# Patient Record
Sex: Female | Born: 2000 | Race: Black or African American | Hispanic: No | Marital: Single | Smoking: Never smoker
Health system: Southern US, Community
[De-identification: ages and names within clinical notes are randomized; demographics above are authoritative.]

## PROBLEM LIST (undated history)

## (undated) DIAGNOSIS — I471 Supraventricular tachycardia: Secondary | ICD-10-CM

---
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
## 2019-01-03 ENCOUNTER — Ambulatory Visit
Admission: RE | Admit: 2019-01-03 | Discharge: 2019-01-03 | Disposition: A | Discharge status: Home / Self Care | Payer: No Typology Code available for payment source | Source: Ambulatory Visit | Attending: Pediatrics | Admitting: Pediatrics

## 2019-01-03 ENCOUNTER — Other Ambulatory Visit: Payer: Self-pay | Admitting: Pediatrics

## 2019-01-03 DIAGNOSIS — N6320 Unspecified lump in the left breast, unspecified quadrant: Secondary | ICD-10-CM

## 2019-07-09 ENCOUNTER — Inpatient Hospital Stay: Admission: RE | Admit: 2019-07-09 | Payer: No Typology Code available for payment source | Source: Ambulatory Visit

## 2019-09-16 ENCOUNTER — Other Ambulatory Visit: Payer: Self-pay

## 2019-09-16 ENCOUNTER — Emergency Department (HOSPITAL_COMMUNITY)
Admission: EM | Admit: 2019-09-16 | Discharge: 2019-09-16 | Disposition: A | Discharge status: Home / Self Care | Payer: BC Managed Care – PPO | Attending: Emergency Medicine | Admitting: Emergency Medicine

## 2019-09-16 ENCOUNTER — Encounter (HOSPITAL_COMMUNITY): Payer: Self-pay

## 2019-09-16 DIAGNOSIS — R531 Weakness: Secondary | ICD-10-CM | POA: Insufficient documentation

## 2019-09-16 DIAGNOSIS — Z711 Person with feared health complaint in whom no diagnosis is made: Secondary | ICD-10-CM | POA: Diagnosis not present

## 2019-09-16 DIAGNOSIS — U071 COVID-19: Secondary | ICD-10-CM | POA: Diagnosis present

## 2019-09-16 NOTE — Discharge Instructions (Signed)
Your vital signs look good today and your oxygen levels are normal.  Continue to stay well-hydrated and monitor your symptoms at home.  If you start feeling worse having any difficulty breathing or other concerning symptoms occur return to the emergency department.

## 2019-09-16 NOTE — ED Triage Notes (Signed)
Pt reports RN advised her to come to ED for BP 145/105. Pt reports she was recently dx w/covid Tuesday and was concerned about her "beat per minute" and she started feeling weak a couple of minutes ago

## 2019-09-16 NOTE — ED Provider Notes (Signed)
[facility] EMERGENCY DEPARTMENT Provider Note   CSN: [id number] Arrival date & time: 09/16/19  1816     History Chief Complaint  Patient presents with  . Weakness    Haley Marks is a 19 y.o. female.  Haley Marks is a 19 y.o. female with a history of SVT, who presents to the emergency department for concerns regarding her heart rate and blood pressure.  Patient states that on Tuesday she was diagnosed with Covid, and aside from some fatigue and decreased appetite she has been fairly asymptomatic.  She has not had fevers, cough, congestion, nausea, vomiting or diarrhea.  She is just felt more tired than usual.  She has been taking it easy over the past few days and was laying on the couch today and was feeling weak.  She checked her heart rate and it was between 60-70.  She did not know if this was normal, she has a history of SVT and was worried that maybe this was too slow.  Her mom checked her blood pressure and it was 145/105, patient does report she was feeling very anxious when her blood pressure was taken, she does not have any history of blood pressure issues.  Her mom called a nurse who recommended that she come in to get checked out.  Patient denies any shortness of breath or chest pain.  She denies any palpitations or sensation of heart racing.  No other aggravating or alleviating factors.        History reviewed. No pertinent past medical history.  There are no problems to display for this patient.   No past surgical history on file.   OB History   No obstetric history on file.     No family history on file.  Social History   Tobacco Use  . Smoking status: Never Smoker  . Smokeless tobacco: Never Used  Substance Use Topics  . Alcohol use: No  . Drug use: Never    Home Medications Prior to Admission medications   Not on File    Allergies    Amoxicillin and Penicillins  Review of Systems   Review of Systems  Constitutional:  Positive for fatigue. Negative for chills and fever.  HENT: Negative.   Respiratory: Negative for cough and shortness of breath.   Cardiovascular: Negative for chest pain, palpitations and leg swelling.  Gastrointestinal: Negative for abdominal pain, diarrhea, nausea and vomiting.  Musculoskeletal: Negative for arthralgias and myalgias.  Skin: Negative for color change and rash.  Neurological: Negative for dizziness, syncope, weakness, light-headedness and numbness.    Physical Exam Updated Vital Signs BP 124/85 (BP Location: Right Arm)   Pulse 79   Temp 99.5 F (37.5 C) (Oral)   Resp 17   Ht 5' (1.524 m)   Wt 59 kg   SpO2 100%   BMI 25.39 kg/m   Physical Exam Vitals and nursing note reviewed.  Constitutional:      General: She is not in acute distress.    Appearance: Normal appearance. She is well-developed and normal weight. She is not ill-appearing or diaphoretic.  HENT:     Head: Normocephalic and atraumatic.  Eyes:     General:        Right eye: No discharge.        Left eye: No discharge.  Neck:     Comments: No rigidity Cardiovascular:     Rate and Rhythm: Normal rate and regular rhythm.     Pulses: Normal pulses.  Heart sounds: Normal heart sounds. No murmur. No friction rub. No gallop.   Pulmonary:     Effort: Pulmonary effort is normal. No respiratory distress.     Breath sounds: Normal breath sounds.     Comments: Respirations equal and unlabored, patient able to speak in full sentences, lungs clear to auscultation bilaterally Abdominal:     General: Bowel sounds are normal. There is no distension.     Palpations: Abdomen is soft. There is no mass.     Tenderness: There is no abdominal tenderness. There is no guarding.     Comments: Abdomen soft, nondistended, nontender to palpation in all quadrants without guarding or peritoneal signs  Musculoskeletal:        General: No deformity.     Cervical back: Neck supple.  Lymphadenopathy:     Cervical: No  cervical adenopathy.  Skin:    General: Skin is warm and dry.     Capillary Refill: Capillary refill takes less than 2 seconds.  Neurological:     Mental Status: She is alert and oriented to person, place, and time.  Psychiatric:        Mood and Affect: Mood normal.        Behavior: Behavior normal.     ED Results / Procedures / Treatments   Labs (all labs ordered are listed, but only abnormal results are displayed) Labs Reviewed - No data to display  EKG None  Radiology No results found.  Procedures Procedures (including critical care time)  Medications Ordered in ED Medications - No data to display  ED Course  I have reviewed the triage vital signs and the nursing notes.  Pertinent labs & imaging results that were available during my care of the patient were reviewed by me and considered in my medical decision making (see chart for details).    MDM Rules/Calculators/A&P                      19 year old female who was recently diagnosed with Covid has been largely asymptomatic aside from fatigue and decreased appetite.  Comes in today worried about her heart rate and blood pressure.  History of SVT but no palpitations or heart racing sensation.  States that she checked her heart rate today when she was feeling fatigued and tired, and it was between 60-70, she states that she does not know what is normal and was worried that this was too low.  Her mom checked her blood pressure and it was a little elevated at 145/105.  Patient was very anxious when blood pressure was taken and she appears anxious here today.  On arrival mildly tachycardic at 101 but all other vitals normal patient is well-appearing.  She has no shortness of breath and is ambulatory in the room maintaining oxygen saturation greater than 99%.  Had discussion with patient about what heart rates are normal and that 60-70 is very normal and appropriate when she is at rest.  I suspect that her blood pressure was mildly  elevating in the setting of anxiety as it is normal here.  Provided patient with reassurance regarding Covid symptoms and discussed return precautions.   At this time there does not appear to be any evidence of an acute emergency medical condition and the patient appears stable for discharge with appropriate outpatient follow up.  Xiana Carns was evaluated in Emergency Department on 09/18/2019 for the symptoms described in the history of present illness. She was evaluated in the context  of the global COVID-19 pandemic, which necessitated consideration that the patient might be at risk for infection with the SARS-CoV-2 virus that causes COVID-19. Institutional protocols and algorithms that pertain to the evaluation of patients at risk for COVID-19 are in a state of rapid change based on information released by regulatory bodies including the CDC and federal and state organizations. These policies and algorithms were followed during the patient's care in the ED.  Final Clinical Impression(s) / ED Diagnoses Final diagnoses:  COVID-19 virus infection  Physically well but worried    Rx / DC Orders ED Discharge Orders    None       Dartha Lodge, [place] 09/18/19 1418    Blane Ohara, MD 09/19/19 1544

## 2019-11-15 ENCOUNTER — Other Ambulatory Visit: Payer: BC Managed Care – PPO

## 2019-11-22 ENCOUNTER — Ambulatory Visit
Admission: RE | Admit: 2019-11-22 | Discharge: 2019-11-22 | Disposition: A | Discharge status: Home / Self Care | Payer: BC Managed Care – PPO | Source: Ambulatory Visit | Attending: Pediatrics | Admitting: Pediatrics

## 2019-11-22 ENCOUNTER — Other Ambulatory Visit: Payer: Self-pay | Admitting: Pediatrics

## 2019-11-22 ENCOUNTER — Other Ambulatory Visit: Payer: Self-pay

## 2019-11-22 DIAGNOSIS — N6320 Unspecified lump in the left breast, unspecified quadrant: Secondary | ICD-10-CM

## 2019-12-04 ENCOUNTER — Other Ambulatory Visit: Payer: BC Managed Care – PPO

## 2020-10-15 DIAGNOSIS — Z03818 Encounter for observation for suspected exposure to other biological agents ruled out: Secondary | ICD-10-CM | POA: Diagnosis not present

## 2020-10-15 DIAGNOSIS — J029 Acute pharyngitis, unspecified: Secondary | ICD-10-CM | POA: Diagnosis not present

## 2020-10-15 DIAGNOSIS — Z20822 Contact with and (suspected) exposure to covid-19: Secondary | ICD-10-CM | POA: Diagnosis not present

## 2020-11-03 DIAGNOSIS — Z20822 Contact with and (suspected) exposure to covid-19: Secondary | ICD-10-CM | POA: Diagnosis not present

## 2020-11-03 DIAGNOSIS — Z03818 Encounter for observation for suspected exposure to other biological agents ruled out: Secondary | ICD-10-CM | POA: Diagnosis not present

## 2020-11-03 DIAGNOSIS — R059 Cough, unspecified: Secondary | ICD-10-CM | POA: Diagnosis not present

## 2020-11-18 ENCOUNTER — Encounter (HOSPITAL_COMMUNITY): Payer: Self-pay | Admitting: Emergency Medicine

## 2020-11-18 ENCOUNTER — Ambulatory Visit (HOSPITAL_COMMUNITY)
Admission: EM | Admit: 2020-11-18 | Discharge: 2020-11-18 | Disposition: A | Discharge status: Home / Self Care | Payer: BC Managed Care – PPO | Attending: Emergency Medicine | Admitting: Emergency Medicine

## 2020-11-18 ENCOUNTER — Other Ambulatory Visit: Payer: Self-pay

## 2020-11-18 DIAGNOSIS — B349 Viral infection, unspecified: Secondary | ICD-10-CM | POA: Diagnosis not present

## 2020-11-18 DIAGNOSIS — Z20822 Contact with and (suspected) exposure to covid-19: Secondary | ICD-10-CM | POA: Insufficient documentation

## 2020-11-18 DIAGNOSIS — J029 Acute pharyngitis, unspecified: Secondary | ICD-10-CM | POA: Insufficient documentation

## 2020-11-18 HISTORY — DX: Supraventricular tachycardia: I47.1

## 2020-11-18 LAB — POC INFLUENZA A AND B ANTIGEN (URGENT CARE ONLY)
INFLUENZA A ANTIGEN, POC: NEGATIVE
INFLUENZA B ANTIGEN, POC: NEGATIVE

## 2020-11-18 MED ORDER — ACETAMINOPHEN 325 MG PO TABS
650.0000 mg | ORAL_TABLET | Freq: Once | ORAL | Status: AC
  Administered 2020-11-18: 650 mg via ORAL

## 2020-11-18 MED ORDER — ACETAMINOPHEN 325 MG PO TABS
ORAL_TABLET | ORAL | Status: AC
  Filled 2020-11-18: qty 2

## 2020-11-18 NOTE — ED Provider Notes (Signed)
[facility]    CSN: [id number] Arrival date & time: 11/18/20  1617      History   Chief Complaint Chief Complaint  Patient presents with  . Weakness  . Nasal Congestion  . Sore Throat  . Cough    HPI Haley Marks is a 20 y.o. female.   Patient here for evaluation of congestion, cough, sore throat and fevers that started today.  Patient reports having a coughing fit and then vomiting while waiting in the waiting room.  Temperature was 101.9 initially in office and given Tylenol.  Denies any recent sick contacts.  Has not taken any OTC medications or treatments.  Denies any trauma, injury, or other precipitating event.  Denies any specific alleviating or aggravating factors.  Denies any fevers, chest pain, shortness of breath, N/V/D, numbness, tingling, weakness, abdominal pain, or headaches.     The history is provided by the patient.  Weakness Associated symptoms: cough and fever   Sore Throat  Cough Associated symptoms: fever     Past Medical History:  Diagnosis Date  . Supraventricular tachycardia (HCC)     There are no problems to display for this patient.   History reviewed. No pertinent surgical history.  OB History   No obstetric history on file.      Home Medications    Prior to Admission medications   Medication Sig Start Date End Date Taking? Authorizing Provider  metoprolol succinate (TOPROL-XL) 25 MG 24 hr tablet Take 1 tablet by mouth daily. 11/17/20  Yes [provider]    Family History History reviewed. No pertinent family history.  Social History Social History   Tobacco Use  . Smoking status: Never Smoker  . Smokeless tobacco: Never Used  Vaping Use  . Vaping Use: Never used  Substance Use Topics  . Alcohol use: No  . Drug use: Never     Allergies   Amoxicillin and Penicillins   Review of Systems Review of Systems  Constitutional: Positive for fever.  HENT: Positive for congestion.    Respiratory: Positive for cough.   Neurological: Positive for weakness.  All other systems reviewed and are negative.    Physical Exam Triage Vital Signs ED Triage Vitals  Enc Vitals Group     BP 11/18/20 1727 104/72     Pulse Rate 11/18/20 1727 (!) 115     Resp 11/18/20 1727 20     Temp 11/18/20 1727 (!) 101.9 F (38.8 C)     Temp Source 11/18/20 1727 Oral     SpO2 11/18/20 1727 97 %     Weight --      Height --      Head Circumference --      Peak Flow --      Pain Score 11/18/20 1724 6     Pain Loc --      Pain Edu? --      Excl. in GC? --    No data found.  Updated Vital Signs BP 104/72 (BP Location: Left Arm)   Pulse (!) 115   Temp 99.9 F (37.7 C) (Oral)   Resp 20   LMP 11/02/2020 (Approximate)   SpO2 97%   Visual Acuity Right Eye Distance:   Left Eye Distance:   Bilateral Distance:    Right Eye Near:   Left Eye Near:    Bilateral Near:     Physical Exam Vitals and nursing note reviewed.  Constitutional:      General: She is  not in acute distress.    Appearance: Normal appearance. She is not ill-appearing, toxic-appearing or diaphoretic.  HENT:     Head: Normocephalic and atraumatic.     Nose: Congestion present.     Mouth/Throat:     Pharynx: Uvula midline. No uvula swelling.     Tonsils: No tonsillar exudate or tonsillar abscesses. 0 on the right. 0 on the left.  Eyes:     Conjunctiva/sclera: Conjunctivae normal.  Cardiovascular:     Rate and Rhythm: Normal rate and regular rhythm.     Pulses: Normal pulses.     Heart sounds: Normal heart sounds.  Pulmonary:     Effort: Pulmonary effort is normal.     Breath sounds: Normal breath sounds.  Abdominal:     General: Abdomen is flat.  Musculoskeletal:        General: Normal range of motion.     Cervical back: Normal range of motion.  Skin:    General: Skin is warm and dry.  Neurological:     General: No focal deficit present.     Mental Status: She is alert and oriented to person, place,  and time.  Psychiatric:        Mood and Affect: Mood normal.      UC Treatments / Results  Labs (all labs ordered are listed, but only abnormal results are displayed) Labs Reviewed  SARS CORONAVIRUS 2 (TAT 6-24 HRS)  POC INFLUENZA A AND B ANTIGEN (URGENT CARE ONLY)    EKG   Radiology No results found.  Procedures Procedures (including critical care time)  Medications Ordered in UC Medications  acetaminophen (TYLENOL) tablet 650 mg (650 mg Oral Given 11/18/20 1734)    Initial Impression / Assessment and Plan / UC Course  I have reviewed the triage vital signs and the nursing notes.  Pertinent labs & imaging results that were available during my care of the patient were reviewed by me and considered in my medical decision making (see chart for details).     Assessment negative for red flags or concerns.  Patient reports feeling much better after Tylenol.  COVID test pending.  Flu negative.  Encourage fluids and rest.  Patient may take Tylenol and/or ibuprofen as needed for pain and fever.  Discussed using ginger and mint as needed for nausea and vomiting.  Try sticking with a BRAT or bland food diet for the next few days and then advance diet as tolerated.  Conservative symptom management discussed as described below in discharge instructions.  Follow-up with primary care as needed. Final Clinical Impressions(s) / UC Diagnoses   Final diagnoses:  Viral illness     Discharge Instructions     You most likely have viral illness.   You can take Tylenol and/or Ibuprofen as needed for fever reduction and pain relief.   It is important to stay hydrated: drink plenty of fluids (water, gatorade/powerade/pedialyte, juices, or teas) to keep your throat moisturized and help further relieve irritation/discomfort.   For vomiting: ginger and mint can help with nausea and vomiting.  Try to stick to a BRAT (bananas, rice, applesause, toast) or bland food diet and then advance as  tolerated.    For cough: honey 1/2 to 1 teaspoon (you can dilute the honey in water or another fluid).  You can use a humidifier for chest congestion and cough.  If you don't have a humidifier, you can sit in the bathroom with the hot shower running.    For sore throat:  try warm salt water gargles, cepacol lozenges, throat spray, warm tea or water with lemon/honey, popsicles or ice, or OTC cold relief medicine for throat discomfort.    For congestion: take a daily anti-histamine like Zyrtec, Claritin, and a oral decongestant to help with post nasal drip that may be irritating your throat.    Return or go to the Emergency Department if symptoms worsen or do not improve in the next few days.      ED Prescriptions    None     PDMP not reviewed this encounter.   Ivette Loyal, NP 11/18/20 1843

## 2020-11-18 NOTE — Discharge Instructions (Signed)
You most likely have viral illness.   You can take Tylenol and/or Ibuprofen as needed for fever reduction and pain relief.   It is important to stay hydrated: drink plenty of fluids (water, gatorade/powerade/pedialyte, juices, or teas) to keep your throat moisturized and help further relieve irritation/discomfort.   For vomiting: ginger and mint can help with nausea and vomiting.  Try to stick to a BRAT (bananas, rice, applesause, toast) or bland food diet and then advance as tolerated.    For cough: honey 1/2 to 1 teaspoon (you can dilute the honey in water or another fluid).  You can use a humidifier for chest congestion and cough.  If you don't have a humidifier, you can sit in the bathroom with the hot shower running.    For sore throat: try warm salt water gargles, cepacol lozenges, throat spray, warm tea or water with lemon/honey, popsicles or ice, or OTC cold relief medicine for throat discomfort.    For congestion: take a daily anti-histamine like Zyrtec, Claritin, and a oral decongestant to help with post nasal drip that may be irritating your throat.    Return or go to the Emergency Department if symptoms worsen or do not improve in the next few days.

## 2020-11-18 NOTE — ED Triage Notes (Signed)
Pt presents today with c/o of cough, sore throat, nasal congestion x 1 days. She had one episode of vomiting in lobby after a series of coughs.

## 2020-11-19 DIAGNOSIS — I471 Supraventricular tachycardia: Secondary | ICD-10-CM | POA: Diagnosis not present

## 2020-11-19 LAB — SARS CORONAVIRUS 2 (TAT 6-24 HRS): SARS Coronavirus 2: NEGATIVE

## 2020-11-24 DIAGNOSIS — J209 Acute bronchitis, unspecified: Secondary | ICD-10-CM | POA: Diagnosis not present

## 2021-06-04 IMAGING — US ULTRASOUND LEFT BREAST LIMITED
1 series · 12 of 12 positions shown · non-contrast
Comparison: None.

CLINICAL DATA: Patient presents with a lump in the lateral left
breast.

EXAM:
ULTRASOUND OF THE LEFT BREAST

[Series 1: ultrasound left breast limited · 0.06mm/px · 12 of 12 slices shown]
[im 1/12]
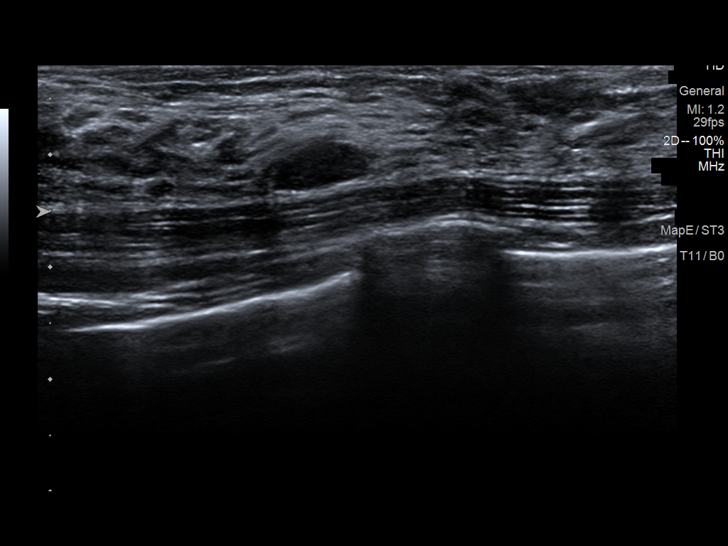
[im 2/12]
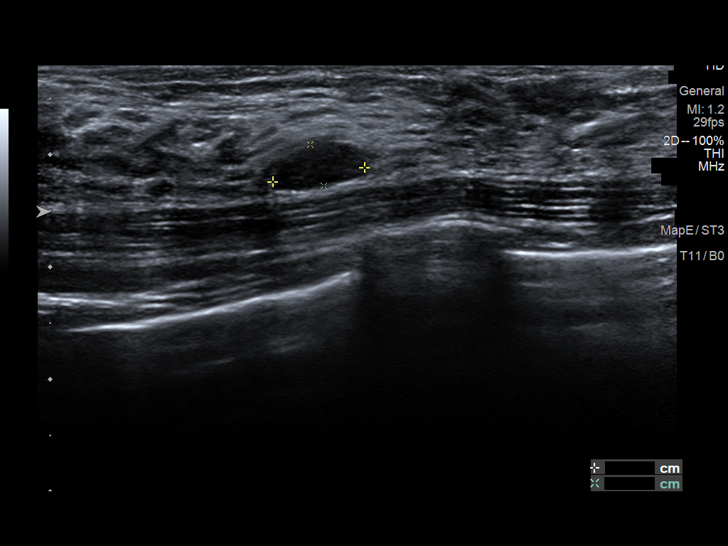
[im 3/12]
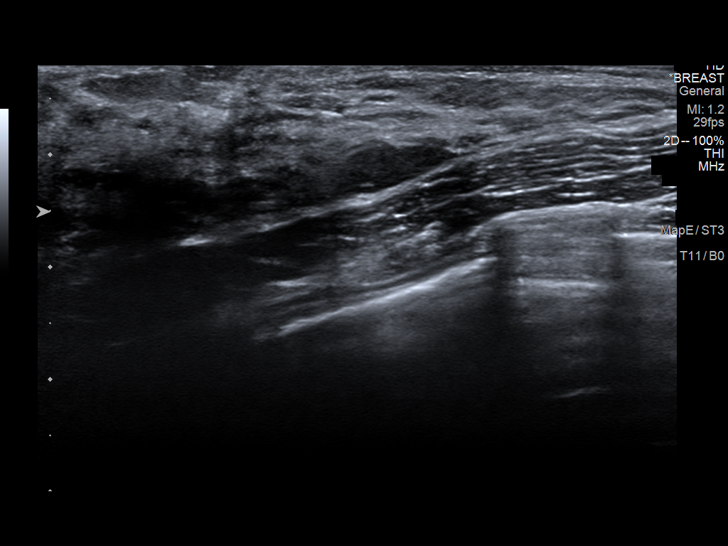
[im 4/12]
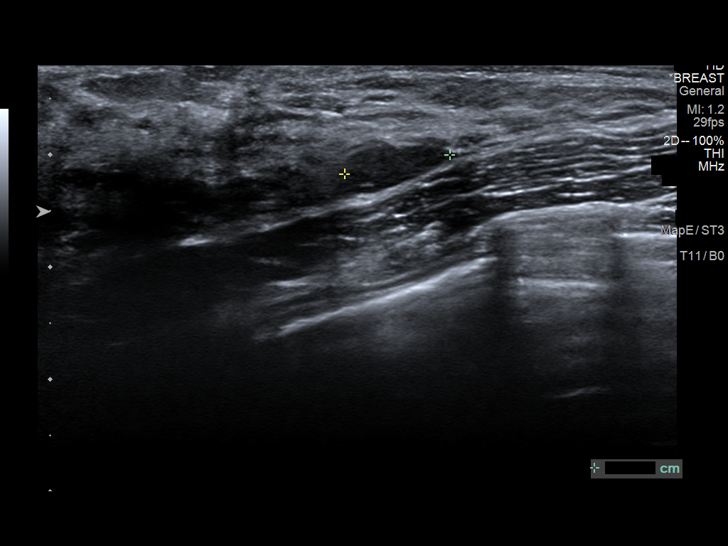
[im 5/12]
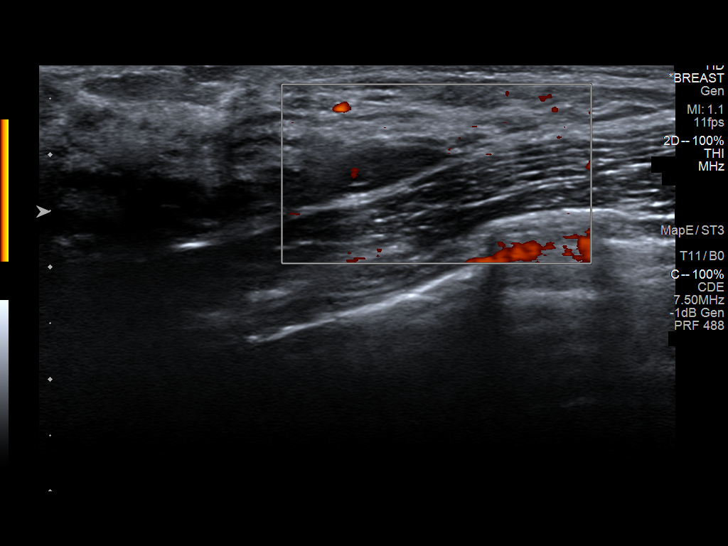
[im 6/12]
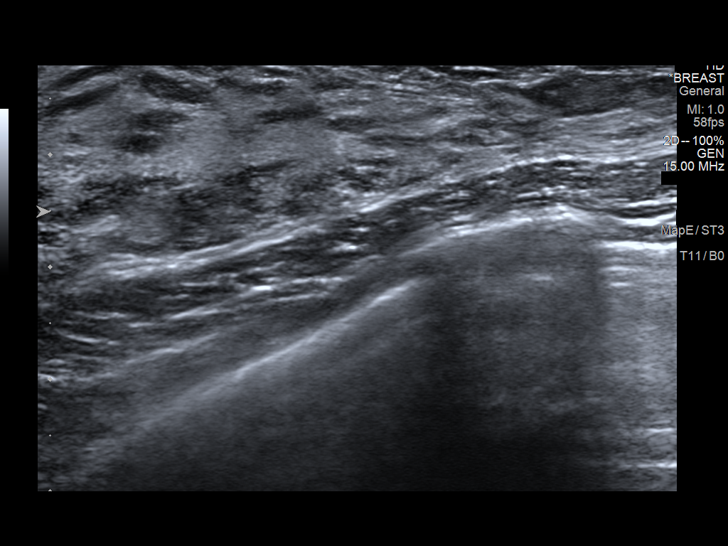
[im 7/12]
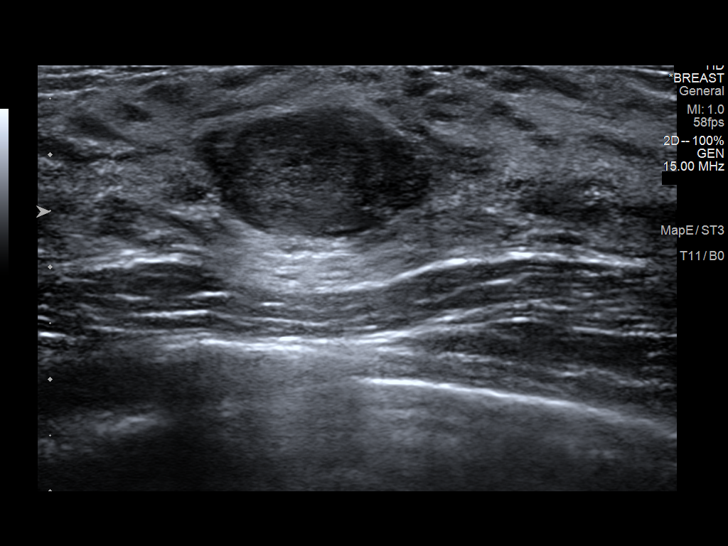
[im 8/12]
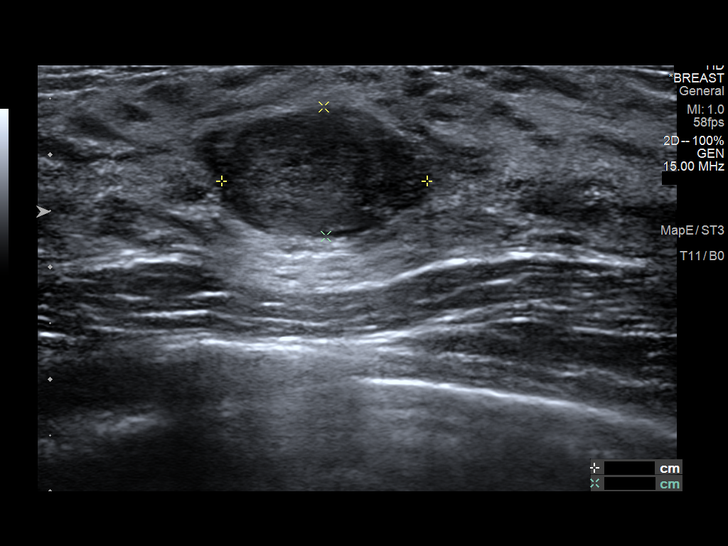
[im 9/12]
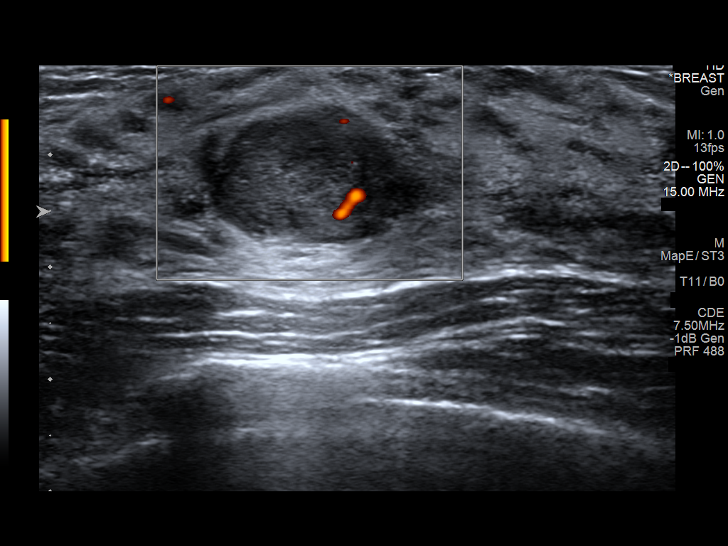
[im 10/12]
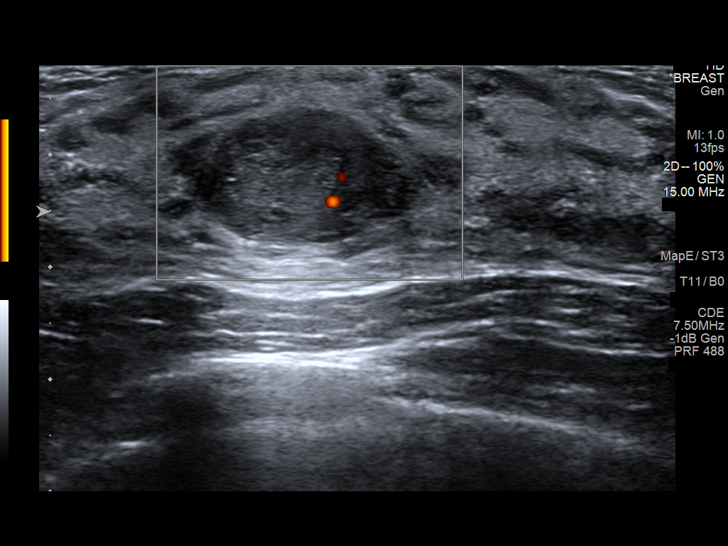
[im 11/12]
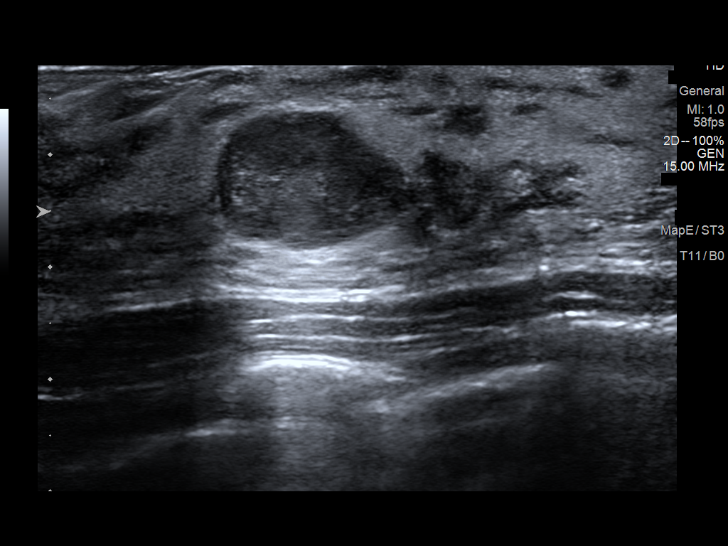
[im 12/12]
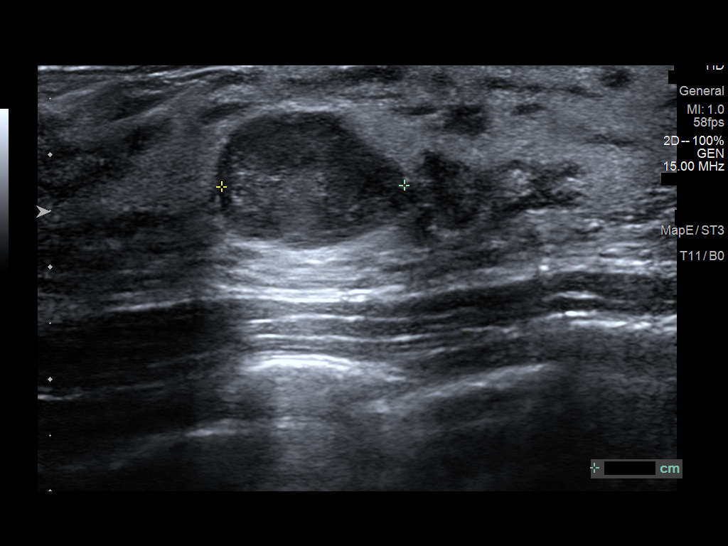

[12 of 12 positions shown; findings below may reference images not displayed]

FINDINGS: On physical exam, no mass is palpated in the lateral left breast.
There is a small mobile smooth palpable mass in the breast just
above the nipple.

Targeted ultrasound is performed, showing normal fibroglandular
tissue in the left breast at 4 o'clock, 3 cm from the nipple, in the
area of the reported palpable lump. No mass or suspicious lesion.

In the 11:30 o'clock position of the left breast, 1 cm from the
nipple, there is an oval, circumscribed, hypoechoic parallel mass
measuring 18 x 12 x 16 mm, corresponding to the mass palpated on
physical exam. In addition, in the 4 o'clock position, 3 cm the
nipple, adjacent to the reported palpable abnormality, there is an
oval, hypoechoic, parallel mass, posterior depth, measuring 10 x 4 x
8 mm.
IMPRESSION: 1. Two probably benign masses in the left breast, an 18 mm mass at
the 11:30 o'clock position and a 10 mm mass at the 4 o'clock
position, both consistent with fibroadenomas. Short-term follow-up
recommended.

RECOMMENDATION:
Repeat left breast ultrasound in 6 months.

I have discussed the findings and recommendations with the patient.
Results were also provided in writing at the conclusion of the
visit. If applicable, a reminder letter will be sent to the patient
regarding the next appointment.

BI-RADS CATEGORY  3: Probably benign.

## 2021-07-30 DIAGNOSIS — R300 Dysuria: Secondary | ICD-10-CM | POA: Diagnosis not present

## 2021-12-18 DIAGNOSIS — A630 Anogenital (venereal) warts: Secondary | ICD-10-CM | POA: Diagnosis not present

## 2021-12-21 DIAGNOSIS — A630 Anogenital (venereal) warts: Secondary | ICD-10-CM | POA: Diagnosis not present

## 2022-01-06 DIAGNOSIS — I471 Supraventricular tachycardia: Secondary | ICD-10-CM | POA: Diagnosis not present

## 2022-04-23 IMAGING — US US BREAST*L* LIMITED INC AXILLA
1 series · 12 of 12 positions shown · non-contrast
Comparison: 01/03/2019

CLINICAL DATA: Short-term follow-up for probably benign left breast
masses, initially assessed on 01/03/2019, 1 of which was palpable.

EXAM:
ULTRASOUND OF THE LEFT BREAST

[Series 1: us breast*left* limited inc axilla · 0.06mm/px · 12 of 12 slices shown]
[im 1/12]
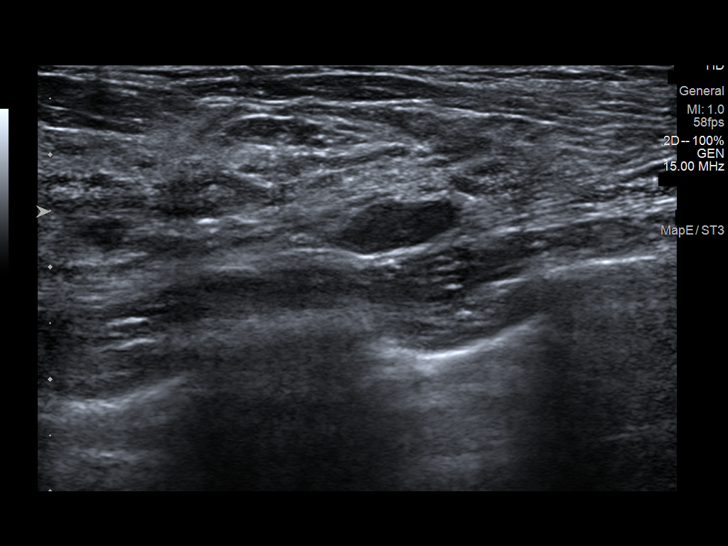
[im 2/12]
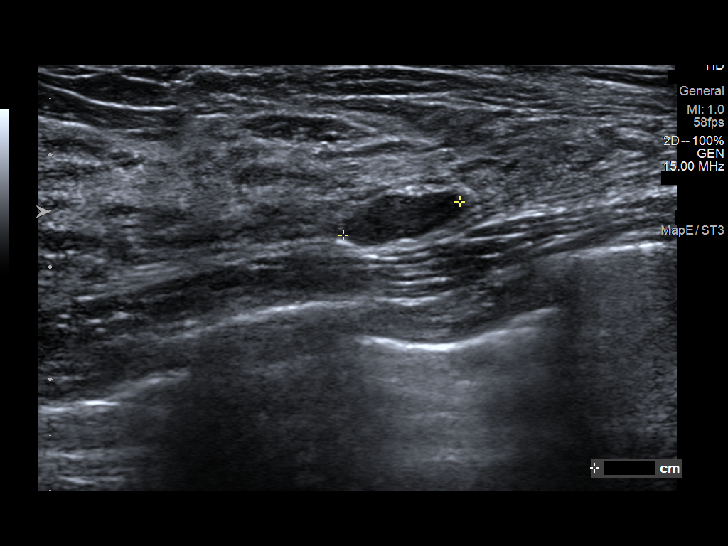
[im 3/12]
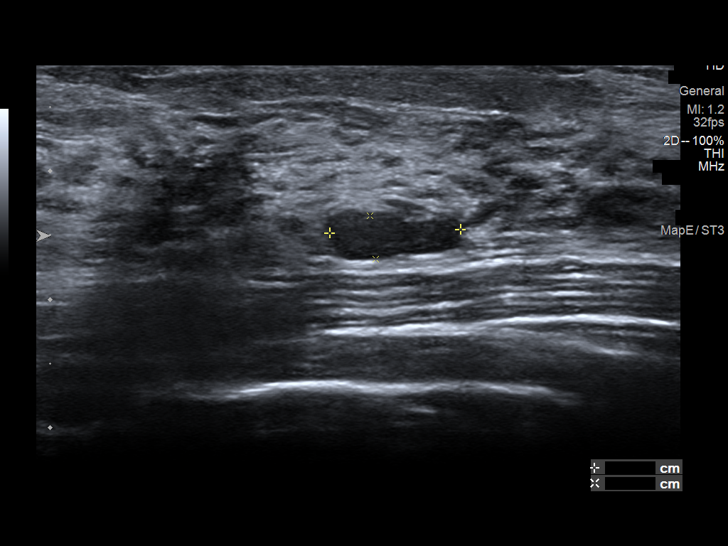
[im 4/12]
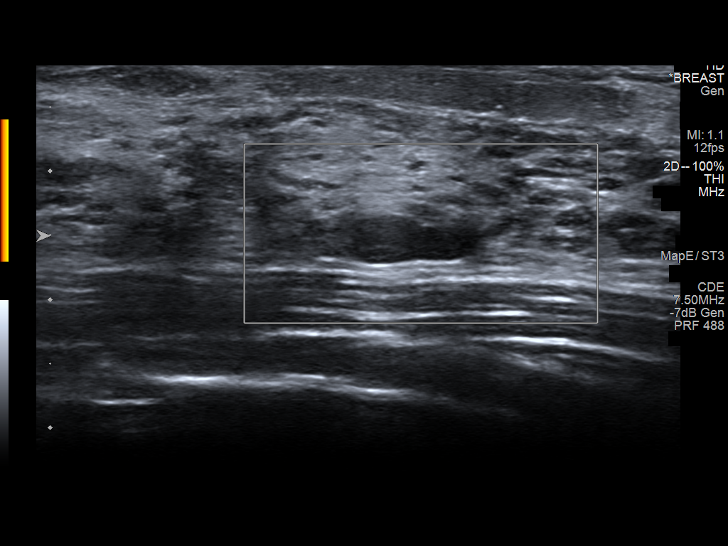
[im 5/12]
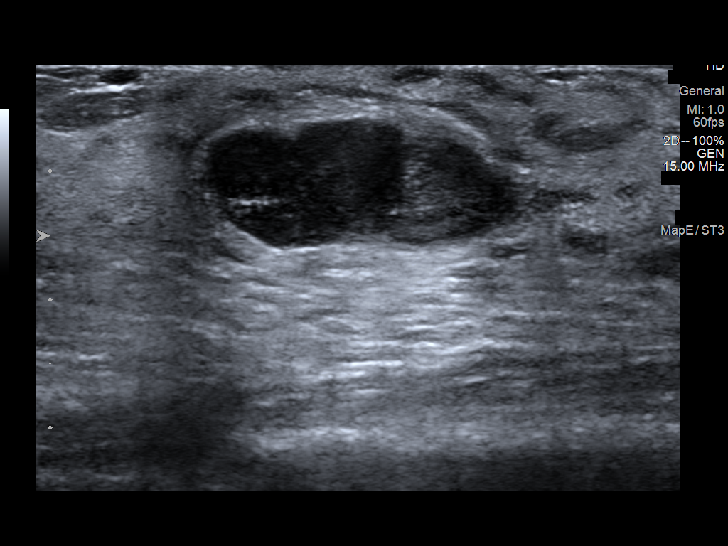
[im 6/12]
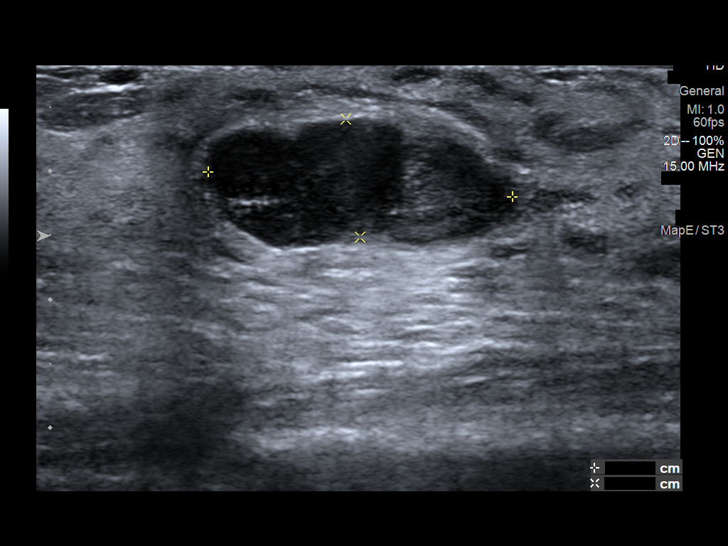
[im 7/12]
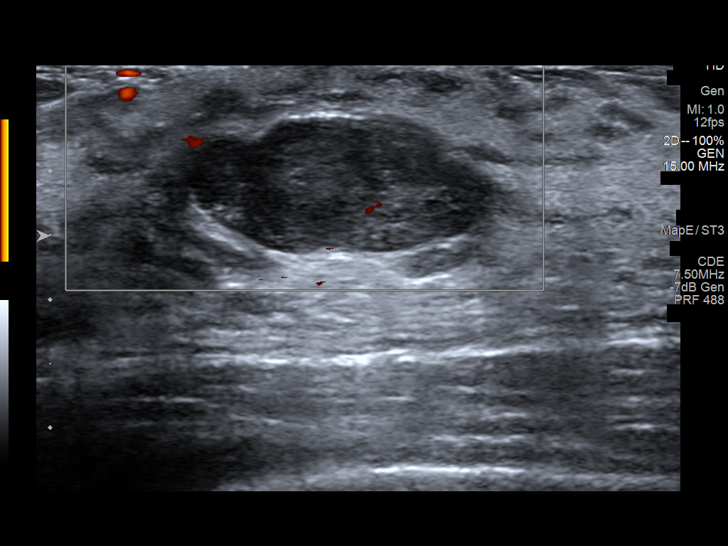
[im 8/12]
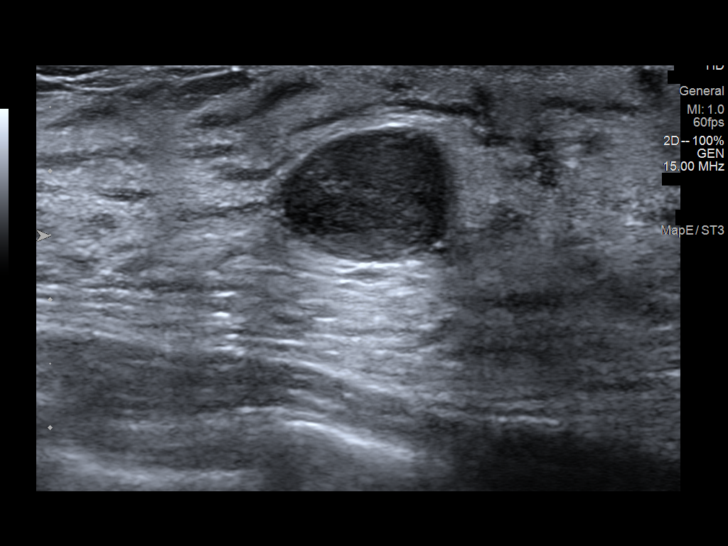
[im 9/12]
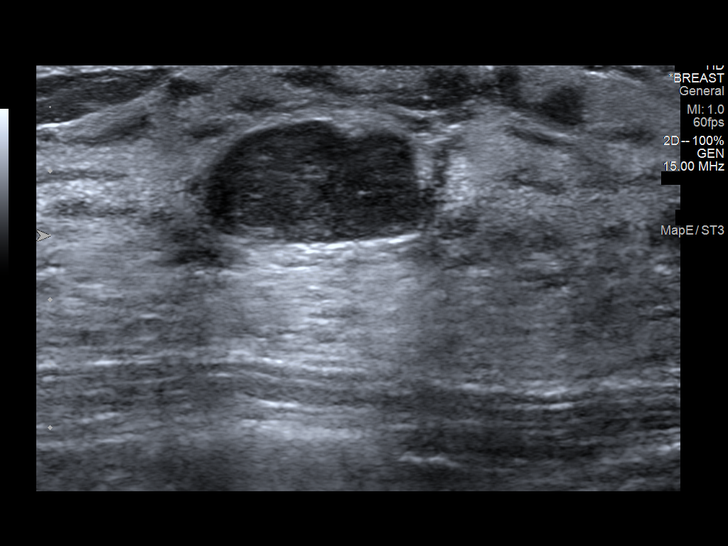
[im 10/12]
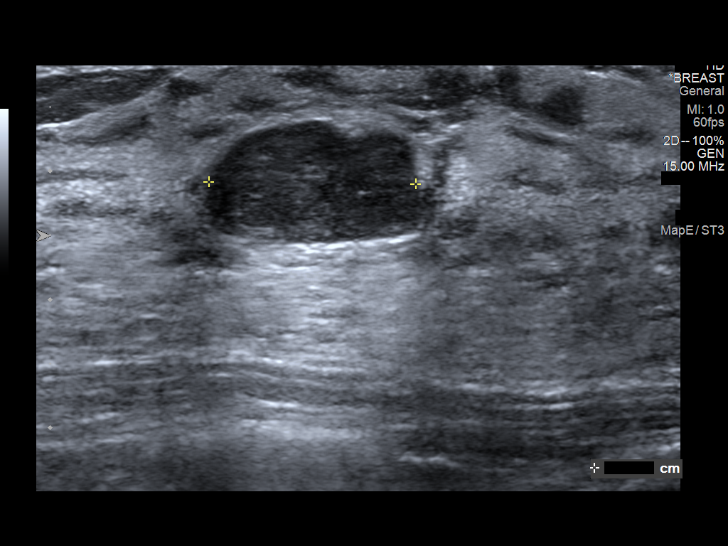
[im 11/12]
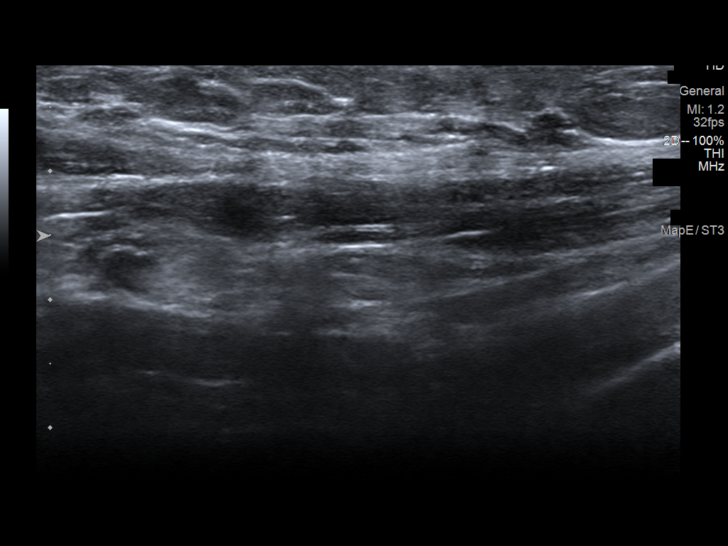
[im 12/12]
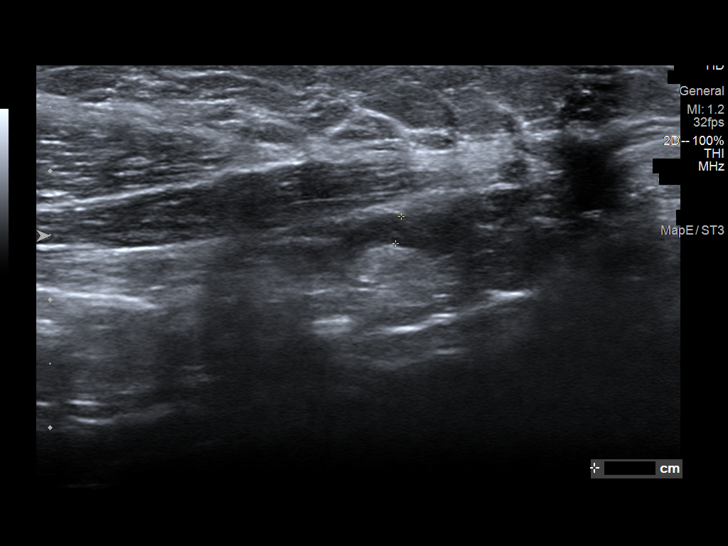

[12 of 12 positions shown; findings below may reference images not displayed]

FINDINGS: Targeted ultrasound is performed, showing a hypoechoic,
circumscribed parallel mass at 11:30 o'clock, 1 cm from the nipple,
measuring 2.4 x 0.9 x 1.6 cm, previously 1.8 x 1.2 x 1.6 cm. At 4
o'clock, 3 cm the nipple, there is a second hypoechoic oval parallel
circumscribed mass measuring 1.1 x 0.3 x 1.0 cm, previously 1.0 x
0.4 x 0.8 cm. Sonographic evaluation of the left axilla shows no
enlarged or abnormal lymph nodes.
IMPRESSION: 1. Mass at 11:30 o'clock, 1 cm from the nipple, has increased in
size from the prior exam to a degree warranting tissue sampling.
2. Second smaller mass has shown a slight increase in size, but is
still consistent with a probably benign mass.

RECOMMENDATION:
1. Ultrasound-guided core needle biopsy of the larger left breast
mass at 11:30 o'clock. This procedure was scheduled prior to the
patient leaving the [REDACTED].
2. Assuming the biopsy yielded benign fibroadenoma, six-month
follow-up for the smaller, 4 o'clock position, mass would be
recommended.

I have discussed the findings and recommendations with the patient.
If applicable, a reminder letter will be sent to the patient
regarding the next appointment.

BI-RADS CATEGORY  4: Suspicious.
